# Patient Record
Sex: Female | Born: 1945 | Race: White | Hispanic: No | Marital: Married | Smoking: Former smoker
Health system: Southern US, Community
[De-identification: ages and names within clinical notes are randomized; demographics above are authoritative.]

## PROBLEM LIST (undated history)

## (undated) DIAGNOSIS — E7800 Pure hypercholesterolemia, unspecified: Secondary | ICD-10-CM

## (undated) DIAGNOSIS — I10 Essential (primary) hypertension: Secondary | ICD-10-CM

## (undated) HISTORY — PX: JOINT REPLACEMENT: SHX530

## (undated) HISTORY — PX: ABDOMINAL HYSTERECTOMY: SHX81

---

## 2015-12-08 ENCOUNTER — Emergency Department: Payer: Medicare Other

## 2015-12-08 ENCOUNTER — Encounter: Payer: Self-pay | Admitting: Emergency Medicine

## 2015-12-08 ENCOUNTER — Emergency Department
Admission: EM | Admit: 2015-12-08 | Discharge: 2015-12-08 | Disposition: A | Discharge status: Other Acute Inpatient Hospital | Payer: Medicare Other | Attending: Emergency Medicine | Admitting: Emergency Medicine

## 2015-12-08 DIAGNOSIS — S3282XA Multiple fractures of pelvis without disruption of pelvic ring, initial encounter for closed fracture: Secondary | ICD-10-CM | POA: Diagnosis not present

## 2015-12-08 DIAGNOSIS — Y92410 Unspecified street and highway as the place of occurrence of the external cause: Secondary | ICD-10-CM | POA: Diagnosis not present

## 2015-12-08 DIAGNOSIS — T148XXA Other injury of unspecified body region, initial encounter: Secondary | ICD-10-CM

## 2015-12-08 DIAGNOSIS — Z791 Long term (current) use of non-steroidal anti-inflammatories (NSAID): Secondary | ICD-10-CM | POA: Insufficient documentation

## 2015-12-08 DIAGNOSIS — S8001XA Contusion of right knee, initial encounter: Secondary | ICD-10-CM | POA: Insufficient documentation

## 2015-12-08 DIAGNOSIS — Z79899 Other long term (current) drug therapy: Secondary | ICD-10-CM | POA: Diagnosis not present

## 2015-12-08 DIAGNOSIS — I10 Essential (primary) hypertension: Secondary | ICD-10-CM | POA: Diagnosis not present

## 2015-12-08 DIAGNOSIS — Y999 Unspecified external cause status: Secondary | ICD-10-CM | POA: Diagnosis not present

## 2015-12-08 DIAGNOSIS — S299XXA Unspecified injury of thorax, initial encounter: Secondary | ICD-10-CM | POA: Diagnosis present

## 2015-12-08 DIAGNOSIS — S32810A Multiple fractures of pelvis with stable disruption of pelvic ring, initial encounter for closed fracture: Secondary | ICD-10-CM

## 2015-12-08 DIAGNOSIS — S2231XA Fracture of one rib, right side, initial encounter for closed fracture: Secondary | ICD-10-CM | POA: Insufficient documentation

## 2015-12-08 DIAGNOSIS — Y939 Activity, unspecified: Secondary | ICD-10-CM | POA: Insufficient documentation

## 2015-12-08 DIAGNOSIS — Z87891 Personal history of nicotine dependence: Secondary | ICD-10-CM | POA: Insufficient documentation

## 2015-12-08 DIAGNOSIS — Z7982 Long term (current) use of aspirin: Secondary | ICD-10-CM | POA: Diagnosis not present

## 2015-12-08 DIAGNOSIS — E7800 Pure hypercholesterolemia, unspecified: Secondary | ICD-10-CM | POA: Insufficient documentation

## 2015-12-08 HISTORY — DX: Essential (primary) hypertension: I10

## 2015-12-08 HISTORY — DX: Pure hypercholesterolemia, unspecified: E78.00

## 2015-12-08 LAB — CBC WITH DIFFERENTIAL/PLATELET
BASOS PCT: 0 %
Basophils Absolute: 0.1 10*3/uL (ref 0–0.1)
EOS ABS: 0.1 10*3/uL (ref 0–0.7)
Eosinophils Relative: 0 %
HCT: 40.8 % (ref 35.0–47.0)
HEMOGLOBIN: 13.7 g/dL (ref 12.0–16.0)
Lymphocytes Relative: 8 %
Lymphs Abs: 1 10*3/uL (ref 1.0–3.6)
MCH: 28.9 pg (ref 26.0–34.0)
MCHC: 33.7 g/dL (ref 32.0–36.0)
MCV: 85.7 fL (ref 80.0–100.0)
Monocytes Absolute: 0.6 10*3/uL (ref 0.2–0.9)
Monocytes Relative: 5 %
NEUTROS PCT: 87 %
Neutro Abs: 10.8 10*3/uL — ABNORMAL HIGH (ref 1.4–6.5)
Platelets: 216 10*3/uL (ref 150–440)
RBC: 4.76 MIL/uL (ref 3.80–5.20)
RDW: 13.3 % (ref 11.5–14.5)
WBC: 12.5 10*3/uL — AB (ref 3.6–11.0)

## 2015-12-08 LAB — COMPREHENSIVE METABOLIC PANEL
ALBUMIN: 4.4 g/dL (ref 3.5–5.0)
ALK PHOS: 64 U/L (ref 38–126)
ALT: 30 U/L (ref 14–54)
ANION GAP: 5 (ref 5–15)
AST: 31 U/L (ref 15–41)
BUN: 21 mg/dL — ABNORMAL HIGH (ref 6–20)
CALCIUM: 9.7 mg/dL (ref 8.9–10.3)
CO2: 23 mmol/L (ref 22–32)
CREATININE: 0.74 mg/dL (ref 0.44–1.00)
Chloride: 107 mmol/L (ref 101–111)
GFR calc Af Amer: >60 mL/min (ref >60)
GFR calc non Af Amer: >60 mL/min (ref >60)
GLUCOSE: 149 mg/dL — AB (ref 65–99)
Potassium: 3.7 mmol/L (ref 3.5–5.1)
SODIUM: 135 mmol/L (ref 135–145)
Total Bilirubin: 1 mg/dL (ref 0.3–1.2)
Total Protein: 7.1 g/dL (ref 6.5–8.1)

## 2015-12-08 MED ORDER — HYDROMORPHONE HCL 1 MG/ML IJ SOLN
1.0000 mg | Freq: Once | INTRAMUSCULAR | Status: AC
  Administered 2015-12-08: 1 mg via INTRAVENOUS
  Filled 2015-12-08: qty 1

## 2015-12-08 MED ORDER — ONDANSETRON 4 MG PO TBDP
4.0000 mg | ORAL_TABLET | Freq: Once | ORAL | Status: AC
  Administered 2015-12-08: 4 mg via ORAL

## 2015-12-08 MED ORDER — HYDROCODONE-ACETAMINOPHEN 5-325 MG PO TABS
2.0000 | ORAL_TABLET | Freq: Once | ORAL | Status: AC
  Administered 2015-12-08: 2 via ORAL
  Filled 2015-12-08: qty 2

## 2015-12-08 MED ORDER — MORPHINE SULFATE (PF) 4 MG/ML IV SOLN
4.0000 mg | Freq: Once | INTRAVENOUS | Status: AC
  Administered 2015-12-08: 4 mg via INTRAVENOUS
  Filled 2015-12-08: qty 1

## 2015-12-08 MED ORDER — IOPAMIDOL (ISOVUE-300) INJECTION 61%
125.0000 mL | Freq: Once | INTRAVENOUS | Status: AC | PRN
  Administered 2015-12-08: 125 mL via INTRAVENOUS

## 2015-12-08 MED ORDER — ONDANSETRON HCL 4 MG/2ML IJ SOLN
4.0000 mg | Freq: Once | INTRAMUSCULAR | Status: AC
  Administered 2015-12-08: 4 mg via INTRAVENOUS
  Filled 2015-12-08: qty 2

## 2015-12-08 MED ORDER — ONDANSETRON 4 MG PO TBDP
ORAL_TABLET | ORAL | Status: AC
  Administered 2015-12-08: 4 mg via ORAL
  Filled 2015-12-08: qty 1

## 2015-12-08 NOTE — ED Notes (Signed)
md in with pt and family.  [facility].

## 2015-12-08 NOTE — ED Notes (Signed)
Pain meds given.  md in with pt again.  Pt alert.  siderails up x 2
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
## 2015-12-08 NOTE — ED Notes (Signed)
Report off to Winn-Dixiejeanina RN
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
## 2015-12-08 NOTE — ED Provider Notes (Addendum)
Medical screening examination/treatment/procedure(s) were performed by non-physician practitioner and as supervising physician I was immediately available for consultation/collaboration.   IMPRESSION: 1. Acute right anterior third, fourth and fifth rib fractures. 2. Fractures involve the superior and inferior left pubic rami. 3. Suspect diastases of the left SI joint with fracture of the left sacral wing. 4. Mild stranding within the left pelvic sidewall region compatible with small hematoma. 5. Nonspecific pulmonary nodules. No follow-up needed if patient is low-risk (and has no known or suspected primary neoplasm). Non-contrast chest CT can be considered in 12 months if patient is high-risk. This recommendation follows the consensus statement: Guidelines for Management of Incidental Pulmonary Nodules Detected on CT Images:From the Fleischner Society 2017; published online before print (10.1148/[id number]). 6. Hiatal hernia. 7. Aortic atherosclerosis  Patient is in no acute distress, currently pain is under control. [facility]. I will discuss with the transfer center.  Emily FilbertJonathan E Williams, MD 12/08/15 1642  Emily FilbertJonathan E Williams, MD 12/08/15 [phone]
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
## 2015-12-08 NOTE — ED Notes (Signed)
Patient transported to CT 

## 2015-12-08 NOTE — ED Notes (Signed)
Report called to Scientist, product/process developmentKara RN charge nurse [facility].
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
